# Patient Record
Sex: Female | Born: 1995 | Race: Black or African American | Hispanic: No | Marital: Single | Smoking: Never smoker
Health system: Southern US, Community
[De-identification: ages and names within clinical notes are randomized; demographics above are authoritative.]

---
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
## 2018-09-03 IMAGING — DX DG SHOULDER 2+V*R*
3 series · 3 of 3 positions shown · non-contrast
Comparison: None.

CLINICAL DATA: 20-year-old female with recent trauma and right
anterior shoulder pain.

EXAM:
RIGHT SHOULDER - 2+ VIEW

[shoulder grashey]
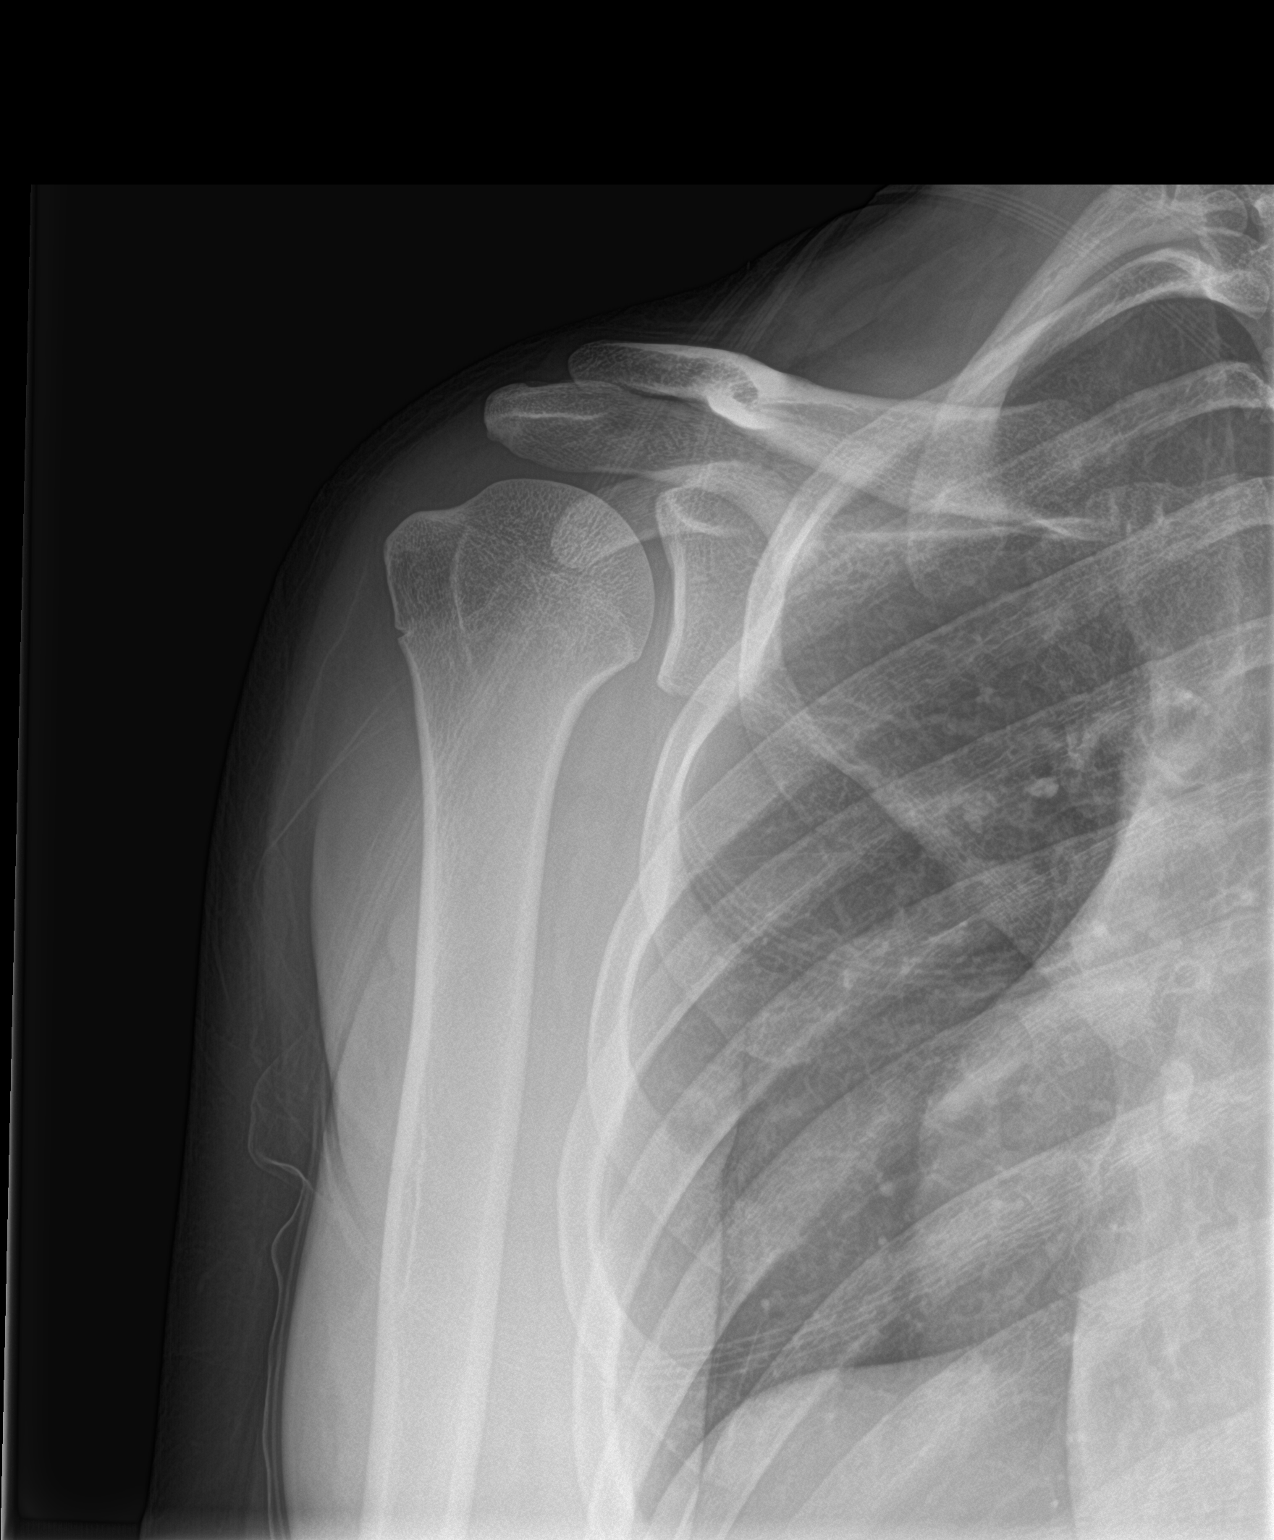

[shoulder y view]
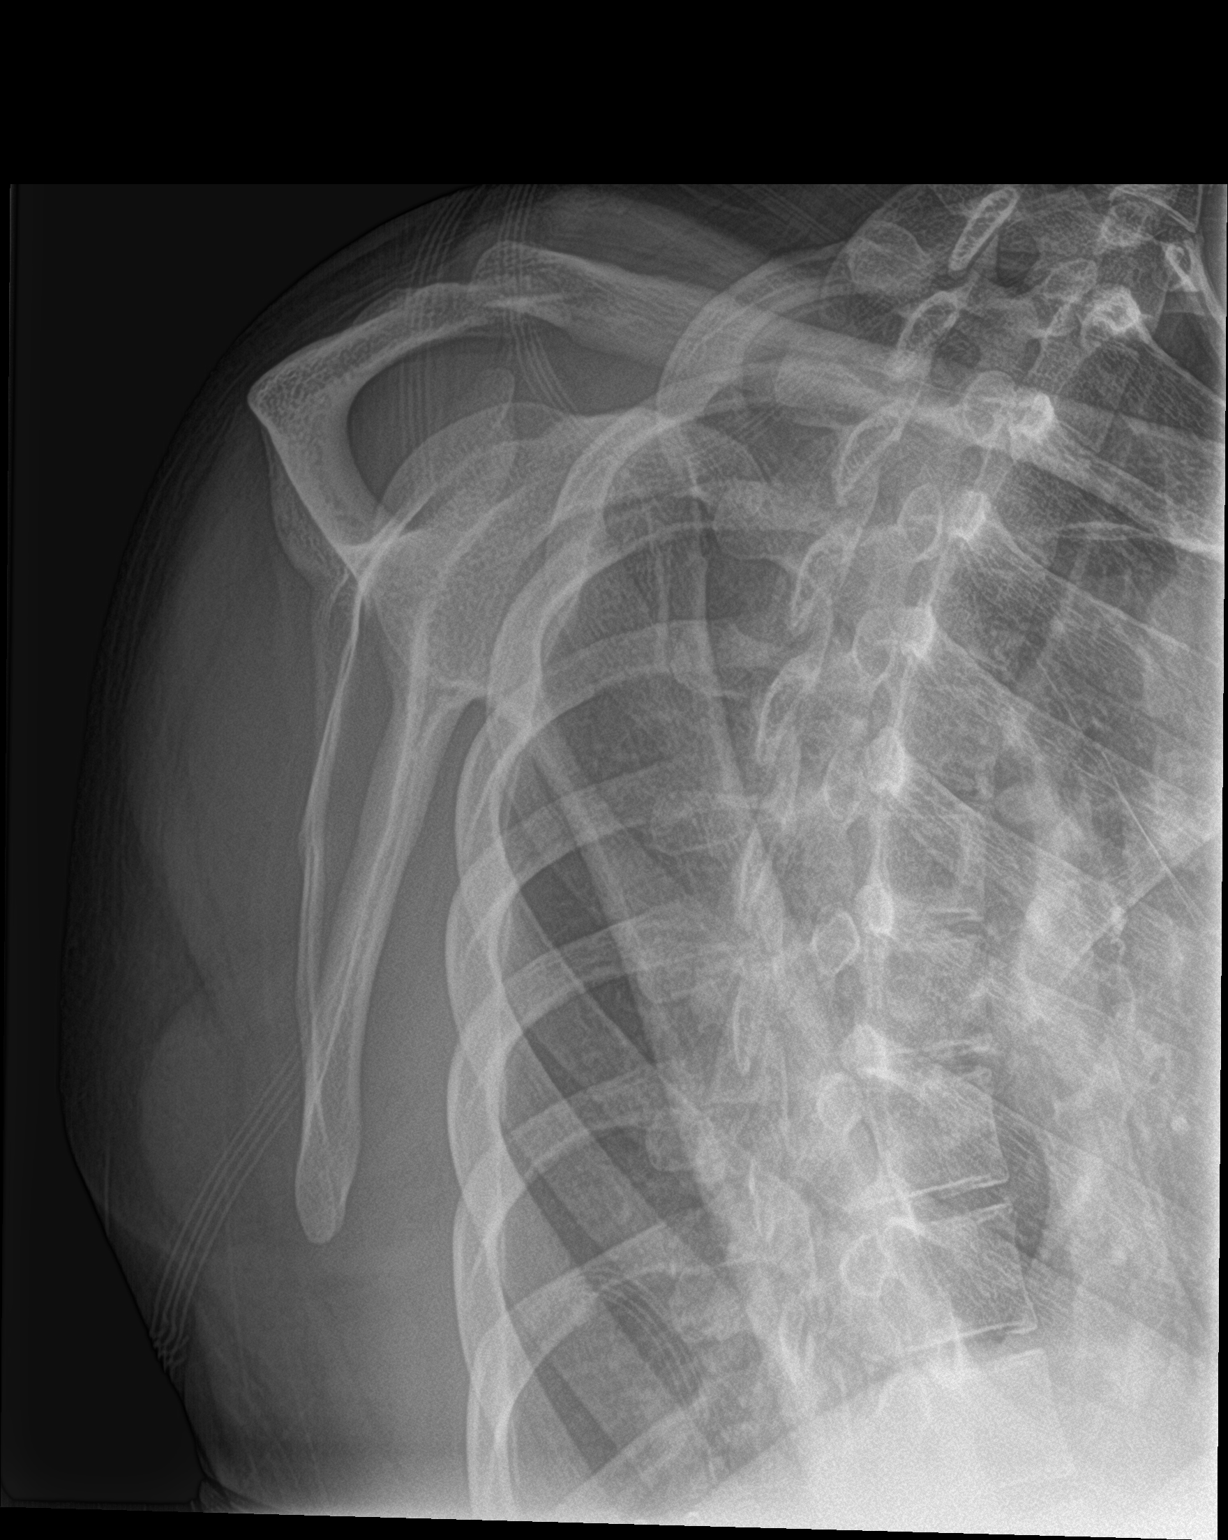

[shoulder axillary]
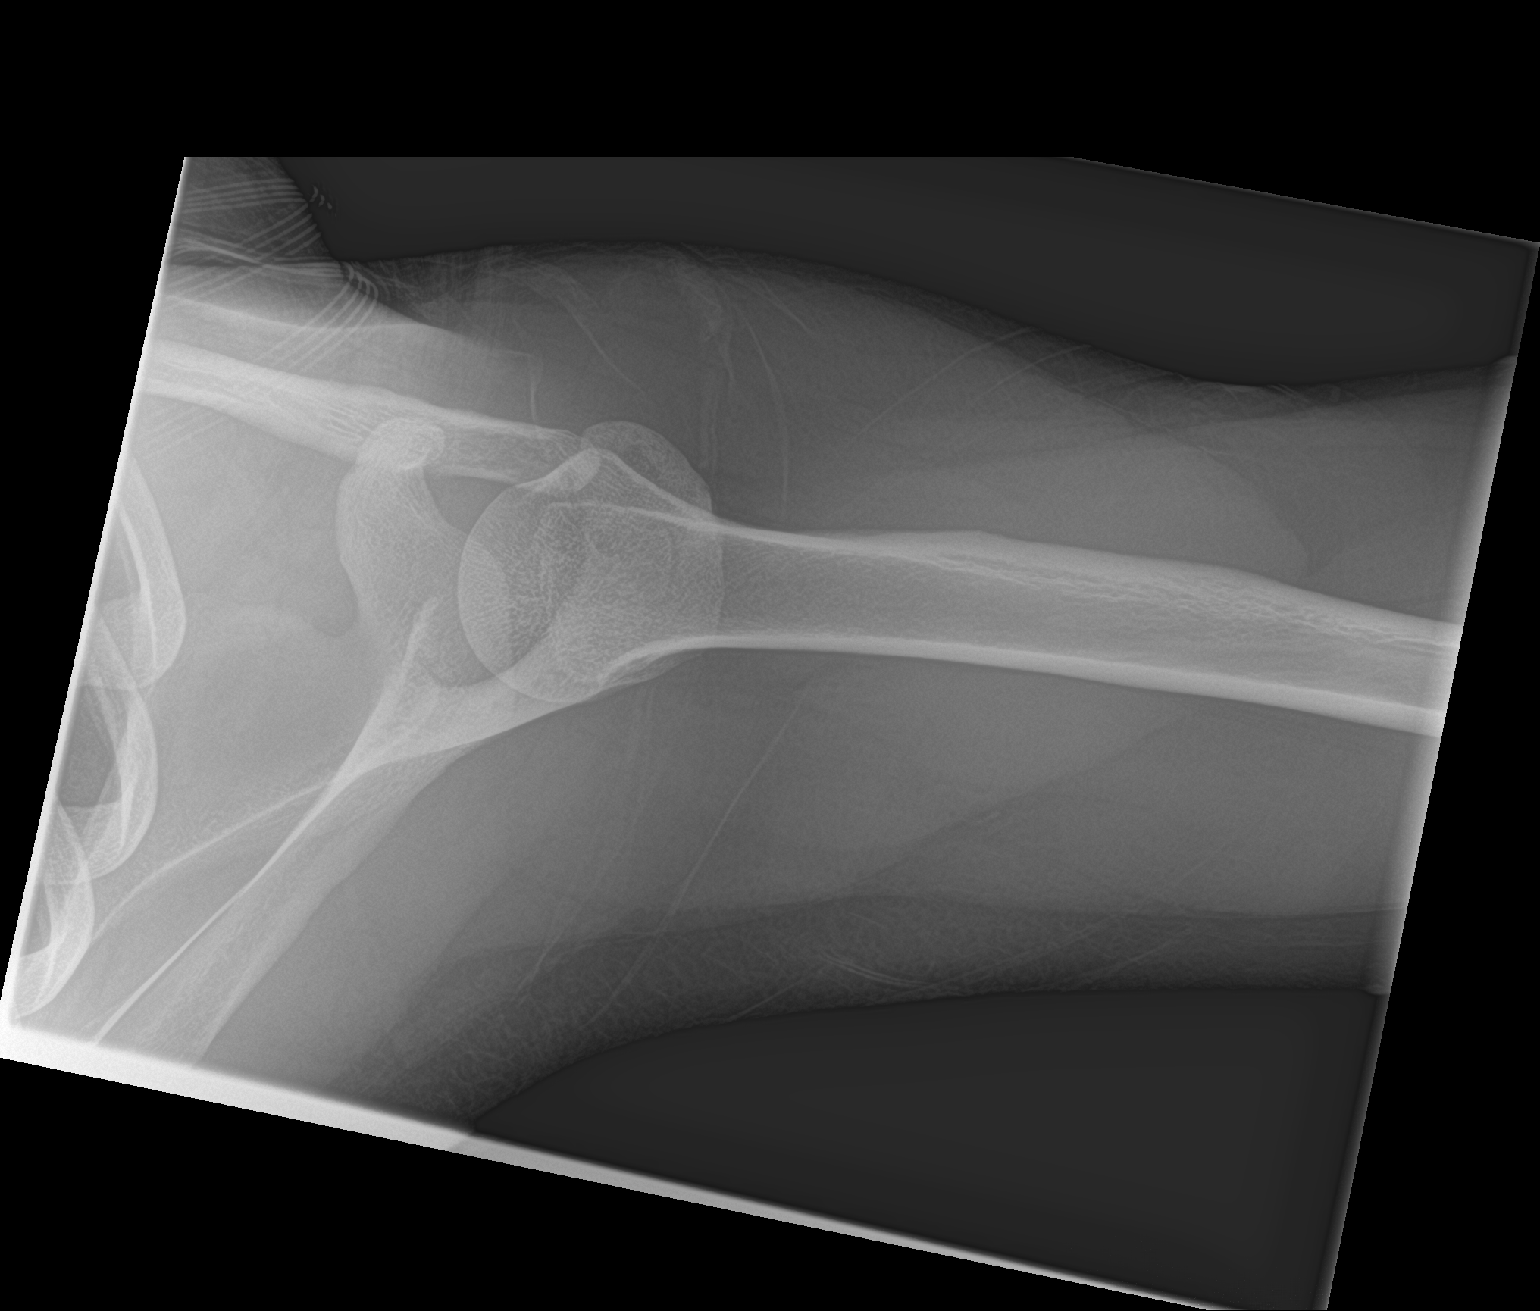

[3 of 3 positions shown; findings below may reference images not displayed]

FINDINGS: There is no evidence of fracture or dislocation. There is no
evidence of arthropathy or other focal bone abnormality. Soft
tissues are unremarkable.
IMPRESSION: Negative.

## 2019-01-04 ENCOUNTER — Emergency Department (HOSPITAL_COMMUNITY)
Admission: EM | Admit: 2019-01-04 | Discharge: 2019-01-04 | Disposition: A | Discharge status: Home / Self Care | Payer: Self-pay | Attending: Emergency Medicine | Admitting: Emergency Medicine

## 2019-01-04 ENCOUNTER — Encounter (HOSPITAL_COMMUNITY): Payer: Self-pay | Admitting: *Deleted

## 2019-01-04 DIAGNOSIS — Z7722 Contact with and (suspected) exposure to environmental tobacco smoke (acute) (chronic): Secondary | ICD-10-CM | POA: Insufficient documentation

## 2019-01-04 DIAGNOSIS — N760 Acute vaginitis: Secondary | ICD-10-CM | POA: Insufficient documentation

## 2019-01-04 DIAGNOSIS — B9689 Other specified bacterial agents as the cause of diseases classified elsewhere: Secondary | ICD-10-CM | POA: Insufficient documentation

## 2019-01-04 LAB — URINALYSIS, ROUTINE W REFLEX MICROSCOPIC
Bilirubin Urine: NEGATIVE
Glucose, UA: NEGATIVE mg/dL
Hgb urine dipstick: NEGATIVE
Ketones, ur: 5 mg/dL — AB
Leukocytes, UA: NEGATIVE
Nitrite: NEGATIVE
Protein, ur: NEGATIVE mg/dL
Specific Gravity, Urine: 1.03 (ref 1.005–1.030)
pH: 5 (ref 5.0–8.0)

## 2019-01-04 LAB — WET PREP, GENITAL
Sperm: NONE SEEN
Trich, Wet Prep: NONE SEEN
Yeast Wet Prep HPF POC: NONE SEEN

## 2019-01-04 LAB — POC URINE PREG, ED: Preg Test, Ur: NEGATIVE

## 2019-01-04 MED ORDER — METRONIDAZOLE 500 MG PO TABS: 500.0000 mg | ORAL_TABLET | Freq: Two times a day (BID) | ORAL | 0 refills | Status: AC

## 2019-01-04 NOTE — ED Provider Notes (Signed)
[name] EMERGENCY DEPARTMENT Provider Note   CSN: [id number] Arrival date & time: 01/04/19  5110   History   Chief Complaint Chief Complaint  Patient presents with  . Abdominal Pain    HPI Renee Vega is a 23 y.o. female.  HPI   23 year old female presents today with complaints of pelvic pain.  Patient notes she had a burning sensation in the right side of her pelvis yesterday that spread to the left.  She denies any vaginal discharge, denies any fever chills nausea or vomiting.  She denies any vaginal pain presently.  She reports she is sexually active with female partner.  She denies any vaginal bleeding.  History reviewed. No pertinent past medical history.  There are no active problems to display for this patient.   History reviewed. No pertinent surgical history.   OB History   No obstetric history on file.      Home Medications    Prior to Admission medications   Medication Sig Start Date End Date Taking? Authorizing Provider  ibuprofen (ADVIL,MOTRIN) 200 MG tablet Take 800 mg by mouth every 6 (six) hours as needed for moderate pain.   Yes [provider]  metroNIDAZOLE (FLAGYL) 500 MG tablet Take 1 tablet (500 mg total) by mouth 2 (two) times daily. 01/04/19   Donnie Gedeon, Tinnie Gens, PA-C  naproxen (NAPROSYN) 500 MG tablet Take 1 tablet (500 mg total) by mouth 2 (two) times daily. Patient not taking: Reported on 01/04/2019 12/30/16   Renne Crigler, PA-C    Family History History reviewed. No pertinent family history.  Social History Social History   Tobacco Use  . Smoking status: Passive Smoke Exposure - Never Smoker  . Smokeless tobacco: Never Used  Substance Use Topics  . Alcohol use: No  . Drug use: No     Allergies   Patient has no known allergies.   Review of Systems Review of Systems  All other systems reviewed and are negative.    Physical Exam Updated Vital Signs BP (!) 139/91 (BP Location: Right Arm)   Pulse  81   Temp 98 F (36.7 C) (Oral)   Resp 20   SpO2 100%   Physical Exam Vitals signs and nursing note reviewed.  Constitutional:      Appearance: She is well-developed.  HENT:     Head: Normocephalic and atraumatic.  Eyes:     General: No scleral icterus.       Right eye: No discharge.        Left eye: No discharge.     Conjunctiva/sclera: Conjunctivae normal.     Pupils: Pupils are equal, round, and reactive to light.  Neck:     Musculoskeletal: Normal range of motion.     Vascular: No JVD.     Trachea: No tracheal deviation.  Pulmonary:     Effort: Pulmonary effort is normal.     Breath sounds: No stridor.  Abdominal:     Comments: Abdomen soft nontender  Genitourinary:    Comments: Sticky white discharge noted in vaginal vault, no cervical motion tenderness Neurological:     Mental Status: She is alert and oriented to person, place, and time.     Coordination: Coordination normal.  Psychiatric:        Behavior: Behavior normal.        Thought Content: Thought content normal.        Judgment: Judgment normal.      ED Treatments / Results  Labs (all labs ordered  are listed, but only abnormal results are displayed) Labs Reviewed  WET PREP, GENITAL - Abnormal; Notable for the following components:      Result Value   Clue Cells Wet Prep HPF POC PRESENT (*)    WBC, Wet Prep HPF POC MANY (*)    All other components within normal limits  URINALYSIS, ROUTINE W REFLEX MICROSCOPIC - Abnormal; Notable for the following components:   Ketones, ur 5 (*)    All other components within normal limits  POC URINE PREG, ED  GC/CHLAMYDIA PROBE AMP ([place]) NOT AT [facility]    EKG None  Radiology No results found.  Procedures Procedures (including critical care time)  Medications Ordered in ED Medications - No data to display   Initial Impression / Assessment and Plan / ED Course  I have reviewed the triage vital signs and the nursing notes.  Pertinent labs &  imaging results that were available during my care of the patient were reviewed by me and considered in my medical decision making (see chart for details).     Patient's presentation was consistent with bacterial vaginosis.  I discussed STD testing and treatment, patient would not like treatment at this time.  She has no signs of PID or any other intrapelvic pathology.  Discharged with symptomatic care instructions, metronidazole and strict return precautions.  She verbalized understanding and agreement to today's plan.  Final Clinical Impressions(s) / ED Diagnoses   Final diagnoses:  Bacterial vaginosis    ED Discharge Orders         Ordered    metroNIDAZOLE (FLAGYL) 500 MG tablet  2 times daily     01/04/19 1154           Rosalio Loud 01/04/19 1157    Tilden Fossa, MD 01/04/19 Windell Moment

## 2019-01-04 NOTE — ED Triage Notes (Signed)
Pt in c/o alternating right and left suprapubic pain, describes as a burning, currently pain to the left side, pain is intermittent, denies n/v/d, denies vaginal discharge or pain with urination

## 2019-01-04 NOTE — Discharge Instructions (Signed)
Please read attached information. If you experience any new or worsening signs or symptoms please return to the emergency room for evaluation. Please follow-up with your primary care provider or specialist as discussed. Please use medication prescribed only as directed and discontinue taking if you have any concerning signs or symptoms.   °

## 2019-01-04 NOTE — ED Notes (Signed)
PA at bedside.

## 2019-01-05 LAB — GC/CHLAMYDIA PROBE AMP (~~LOC~~) NOT AT ARMC
Chlamydia: NEGATIVE
Neisseria Gonorrhea: NEGATIVE

## 2019-02-25 ENCOUNTER — Other Ambulatory Visit: Payer: Self-pay

## 2019-02-25 ENCOUNTER — Emergency Department (HOSPITAL_COMMUNITY)
Admission: EM | Admit: 2019-02-25 | Discharge: 2019-02-26 | Disposition: A | Discharge status: Home / Self Care | Payer: Self-pay | Attending: Emergency Medicine | Admitting: Emergency Medicine

## 2019-02-25 ENCOUNTER — Encounter (HOSPITAL_COMMUNITY): Payer: Self-pay

## 2019-02-25 DIAGNOSIS — Z7722 Contact with and (suspected) exposure to environmental tobacco smoke (acute) (chronic): Secondary | ICD-10-CM | POA: Insufficient documentation

## 2019-02-25 DIAGNOSIS — N760 Acute vaginitis: Secondary | ICD-10-CM | POA: Insufficient documentation

## 2019-02-25 DIAGNOSIS — B9689 Other specified bacterial agents as the cause of diseases classified elsewhere: Secondary | ICD-10-CM

## 2019-02-25 NOTE — ED Triage Notes (Signed)
Pt having vaginal discharge and irritation for 3 days. Discharge is yellow and has slight smell.

## 2019-02-26 LAB — WET PREP, GENITAL
Sperm: NONE SEEN
Trich, Wet Prep: NONE SEEN
Yeast Wet Prep HPF POC: NONE SEEN

## 2019-02-26 MED ORDER — METRONIDAZOLE 500 MG PO TABS
500.0000 mg | ORAL_TABLET | Freq: Once | ORAL | Status: AC
  Administered 2019-02-26: 500 mg via ORAL
  Filled 2019-02-26: qty 1

## 2019-02-26 NOTE — ED Provider Notes (Signed)
[name] EMERGENCY DEPARTMENT Provider Note   CSN: [id number] Arrival date & time: 02/25/19  2241    History   Chief Complaint Chief Complaint  Patient presents with  . Vaginal Discharge    HPI Renee Vega is a 23 y.o. female.     HPI 23 year old female comes in a chief complaint of vaginal discharge. Patient states that she has been having 3 to 4 days of vaginal discharge.  She has had history of bacterial vaginosis and her symptoms are similar.  She is having copious, foul-smelling yellow discharge.  She states that she is having unprotected intercourse with 1 partner and has no abdominal pain or vaginal bleeding.  Patient does indicate that she has had STDs about 3 years ago.   History reviewed. No pertinent past medical history.  There are no active problems to display for this patient.   History reviewed. No pertinent surgical history.   OB History   No obstetric history on file.      Home Medications    Prior to Admission medications   Medication Sig Start Date End Date Taking? Authorizing Provider  ibuprofen (ADVIL,MOTRIN) 200 MG tablet Take 800 mg by mouth every 6 (six) hours as needed for moderate pain.    [provider]  metroNIDAZOLE (FLAGYL) 500 MG tablet Take 1 tablet (500 mg total) by mouth 2 (two) times daily. 01/04/19   Hedges, Tinnie Gens, PA-C  naproxen (NAPROSYN) 500 MG tablet Take 1 tablet (500 mg total) by mouth 2 (two) times daily. Patient not taking: Reported on 01/04/2019 12/30/16   Renne Crigler, PA-C    Family History History reviewed. No pertinent family history.  Social History Social History   Tobacco Use  . Smoking status: Passive Smoke Exposure - Never Smoker  . Smokeless tobacco: Never Used  Substance Use Topics  . Alcohol use: No  . Drug use: No     Allergies   Patient has no known allergies.   Review of Systems Review of Systems  Gastrointestinal: Negative for abdominal pain.   Genitourinary: Positive for vaginal discharge.     Physical Exam Updated Vital Signs BP 119/72   Pulse (!) 54   Temp 97.8 F (36.6 C) (Oral)   Ht 5\' 6"  (1.676 m)   Wt 73.5 kg   LMP 02/24/2019 (Exact Date)   SpO2 99%   BMI 26.15 kg/m   Physical Exam Constitutional:      Appearance: She is well-developed.  HENT:     Head: Normocephalic and atraumatic.  Eyes:     Conjunctiva/sclera: Conjunctivae normal.     Pupils: Pupils are equal, round, and reactive to light.  Neck:     Musculoskeletal: Normal range of motion and neck supple.  Cardiovascular:     Rate and Rhythm: Normal rate and regular rhythm.     Heart sounds: Normal heart sounds. No murmur.  Pulmonary:     Effort: Pulmonary effort is normal. No respiratory distress.     Breath sounds: No wheezing.  Abdominal:     General: Bowel sounds are normal. There is no distension.     Palpations: Abdomen is soft.     Tenderness: There is no abdominal tenderness. There is no guarding or rebound.  Genitourinary:    Vagina: Normal.     Comments: External exam - normal, no lesions Speculum exam: Pt has some white discharge, no blood Bimanual exam: Patient has no CMT, no adnexal tenderness or fullness and cervical os is closed Skin:  General: Skin is warm and dry.  Neurological:     Mental Status: She is alert and oriented to person, place, and time.      ED Treatments / Results  Labs (all labs ordered are listed, but only abnormal results are displayed) Labs Reviewed  WET PREP, GENITAL - Abnormal; Notable for the following components:      Result Value   Clue Cells Wet Prep HPF POC PRESENT (*)    WBC, Wet Prep HPF POC MANY (*)    All other components within normal limits  POC URINE PREG, ED  GC/CHLAMYDIA PROBE AMP ([place]) NOT AT [facility]    EKG None  Radiology No results found.  Procedures Procedures (including critical care time)  Medications Ordered in ED Medications  metroNIDAZOLE (FLAGYL) tablet  500 mg (has no administration in time range)     Initial Impression / Assessment and Plan / ED Course  I have reviewed the triage vital signs and the nursing notes.  Pertinent labs & imaging results that were available during my care of the patient were reviewed by me and considered in my medical decision making (see chart for details).        23 year old comes in a chief complaint of vaginal discharge.  She has history of STIs and also bacterial vaginosis.  She currently does not have high risk sexual behavior besides having unprotected intercourse with her partner with whom she is in monogamous relationship she reports that she has had similar symptoms in the past with BV.  Pelvic exam is reassuring.  Wet prep is positive for clue cells and inflammatory cells.  At this time patient has been advised to have protected intercourse only.  She will need to be treated with STI if her swabs are positive.  Final Clinical Impressions(s) / ED Diagnoses   Final diagnoses:  BV (bacterial vaginosis)    ED Discharge Orders    None       Derwood Kaplan, MD 02/26/19 0145

## 2019-02-26 NOTE — Discharge Instructions (Addendum)
Take the medicines prescribed for bacterial vaginosis.  If you have any STIs you will receive a call from the hospital.

## 2019-02-27 LAB — POC URINE PREG, ED: Preg Test, Ur: NEGATIVE

## 2019-02-27 LAB — GC/CHLAMYDIA PROBE AMP (~~LOC~~) NOT AT ARMC
Chlamydia: NEGATIVE
NEISSERIA GONORRHEA: NEGATIVE

## 2022-07-30 ENCOUNTER — Other Ambulatory Visit: Payer: Self-pay

## 2022-07-30 ENCOUNTER — Emergency Department (HOSPITAL_COMMUNITY)
Admission: EM | Admit: 2022-07-30 | Discharge: 2022-07-30 | Disposition: A | Discharge status: Home / Self Care | Payer: Self-pay | Attending: Emergency Medicine | Admitting: Emergency Medicine

## 2022-07-30 DIAGNOSIS — Z711 Person with feared health complaint in whom no diagnosis is made: Secondary | ICD-10-CM | POA: Insufficient documentation

## 2022-07-30 NOTE — ED Provider Notes (Signed)
[name] EMERGENCY DEPARTMENT Provider Note   CSN: [id number] Arrival date & time: 07/30/22  1728     History  Chief Complaint  Patient presents with   Fibroids    Renee Vega is a 26 y.o. female.  The history is provided by the patient. No language interpreter was used.     26 year old female presents with concerns of potential uterine fibroid.  Patient states last month she went to the health clinic for yearly checkup.  States that the provider was pushing on her abdomen and states that she may have a uterine fibroid.  Is without any complaint except occasionally felt a little twinge sensation in her lower abdomen.  She denies any fever nausea vomiting diarrhea dysuria hematuria excessive vaginal bleeding or discharge.  She does not have any other medical problems.  She is here requesting for an ultrasound to confirm her uterine fibroid.  No treatment tried at home.  Home Medications Prior to Admission medications   Medication Sig Start Date End Date Taking? Authorizing Provider  ibuprofen (ADVIL,MOTRIN) 200 MG tablet Take 800 mg by mouth every 6 (six) hours as needed for moderate pain.    [provider]  metroNIDAZOLE (FLAGYL) 500 MG tablet Take 1 tablet (500 mg total) by mouth 2 (two) times daily. 01/04/19   Hedges, Tinnie Gens, PA-C  naproxen (NAPROSYN) 500 MG tablet Take 1 tablet (500 mg total) by mouth 2 (two) times daily. Patient not taking: Reported on 01/04/2019 12/30/16   Renne Crigler, PA-C      Allergies    Patient has no known allergies.    Review of Systems   Review of Systems  All other systems reviewed and are negative.   Physical Exam Updated Vital Signs BP (!) 142/89 (BP Location: Right Arm)   Pulse 99   Temp 99.7 F (37.6 C) (Oral)   Resp 16   SpO2 97%  Physical Exam Vitals and nursing note reviewed.  Constitutional:      General: She is not in acute distress.    Appearance: She is well-developed.  HENT:     Head:  Atraumatic.  Eyes:     Conjunctiva/sclera: Conjunctivae normal.  Pulmonary:     Effort: Pulmonary effort is normal.  Abdominal:     Palpations: Abdomen is soft.     Tenderness: There is no abdominal tenderness.  Musculoskeletal:     Cervical back: Neck supple.  Skin:    Findings: No rash.  Neurological:     Mental Status: She is alert.  Psychiatric:        Mood and Affect: Mood normal.     ED Results / Procedures / Treatments   Labs (all labs ordered are listed, but only abnormal results are displayed) Labs Reviewed - No data to display  EKG None  Radiology No results found.  Procedures Procedures    Medications Ordered in ED Medications - No data to display  ED Course/ Medical Decision Making/ A&P                           Medical Decision Making  BP (!) 142/89 (BP Location: Right Arm)   Pulse 99   Temp 99.7 F (37.6 C) (Oral)   Resp 16   SpO2 97%   5:53 PM This is a generally healthy 26 year old female presenting requesting to be evaluated for potential uterine fibroid.  She reports she was seen at the health clinic a month ago  for yearly checkup and states the provider was palpating her abdomen and told her that she may have a uterine fibroid.  Patient without any specific complaint except occasional "twinge" sensation in her abdomen without any significant pain.  She denies any excessive vaginal bleeding, having nausea vomiting diarrhea dysuria or any other concerning symptom.  No active pain.  No history of fibroid.  On exam this is a well-appearing female appears to be in no acute discomfort.  No significant reproducible abdominal tenderness.  I encourage patient to follow-up with her primary care provider for outpatient ultrasound for further assessment however no immediate urgent need for ER intervention at this time.  Patient voiced understanding and agrees with plan.  Return precaution given.        Final Clinical Impression(s) / ED  Diagnoses Final diagnoses:  Worried well    Rx / DC Orders ED Discharge Orders     None         Fayrene Helper, PA-C 07/30/22 1756    Loetta Rough, MD 07/31/22 [phone]

## 2022-07-30 NOTE — Discharge Instructions (Signed)
Please consider follow-up with your primary care provider and request for pelvic ultrasound if you have concerns about uterine fibroid.  If you develop severe vaginal bleeding with severe abdominal pain please return to the ER for further assessment.

## 2022-07-30 NOTE — ED Triage Notes (Signed)
Pt presents stating she has had diagnosis of fibroid on the right. States she has followed the physician's recommendations but still feels what she describes as a "twinge" at night when she lies down. No complaints today however. No fever, chills, n/v/d or urinary symptoms. No vaginal disharge or bleeding.

## 2022-12-24 ENCOUNTER — Other Ambulatory Visit: Payer: Self-pay

## 2022-12-24 ENCOUNTER — Encounter: Payer: Self-pay | Admitting: Obstetrics and Gynecology

## 2022-12-24 ENCOUNTER — Ambulatory Visit (INDEPENDENT_AMBULATORY_CARE_PROVIDER_SITE_OTHER): Payer: Self-pay | Admitting: Obstetrics and Gynecology

## 2022-12-24 VITALS — BP 129/84 | HR 60 | Ht 66.0 in | Wt 136.0 lb

## 2022-12-24 DIAGNOSIS — R1900 Intra-abdominal and pelvic swelling, mass and lump, unspecified site: Secondary | ICD-10-CM

## 2022-12-24 NOTE — Patient Instructions (Addendum)
Getting a pelvic ultrasound to look for fibroids and get a better look at your ovaries.

## 2022-12-24 NOTE — Progress Notes (Signed)
    GYNECOLOGY VISIT  Patient name: Renee Vega MRN [id number]  Date of birth: 28-Sep-1996 Chief Complaint:   Gynecologic Exam  History:  Tyniya Kuyper is a 27 y.o. being seen today for concern for fibroid. Tingling feeling in her abdomen and feels she can't hold her urine. Menses are regular, not heavy or painful.     Last menses was totally fine - no pain. Lump on right side. Having some discomfort while sleeping due to burning sensation in lower abdomen. Burning sensatino was only at night, last felt in October and none since then. Not currently sexually active; no pain with penetrative intercours previously. Not using nuvaring as she is not sexually active. Lump that was felt previously feels like it's in the middle. Mom, has hx of fibroids and states it feels the same as when she was diagnosed.   No past medical history on file.  No past surgical history on file.  The following portions of the patient's history were reviewed and updated as appropriate: allergies, current medications, past family history, past medical history, past social history, past surgical history and problem list.   Health Maintenance:   Last pap reported 2023 but results not able to be located Last mammogram: n/a   Review of Systems:  Pertinent items are noted in HPI. Comprehensive review of systems was otherwise negative.   Objective:  Physical Exam BP 129/84   Pulse 60   Ht 5\' 6"  (1.676 m)   Wt 136 lb (61.7 kg)   LMP 12/18/2022 (Exact Date)   BMI 21.95 kg/m    Physical Exam Vitals and nursing note reviewed.  Constitutional:      Appearance: Normal appearance.  HENT:     Head: Normocephalic and atraumatic.  Pulmonary:     Effort: Pulmonary effort is normal.     Breath sounds: Normal breath sounds.  Abdominal:     Comments: 16 week size presumed uterus, mild tenderness to palpation, mobile   Genitourinary:    Vagina: Normal.     Cervix: Normal.  Skin:    General: Skin is warm and dry.   Neurological:     General: No focal deficit present.     Mental Status: She is alert.  Psychiatric:        Mood and Affect: Mood normal.        Behavior: Behavior normal.        Thought Content: Thought content normal.        Judgment: Judgment normal.    Labs and Imaging No results found.     Assessment & Plan:   1. Pelvic mass Suspect likely fibroid on examination, [place] ordered. Not having heavy bleeding or pain and not currently trying to get pregnant. Will discuss management pending [place].  - US PELVIC COMPLETE WITH TRANSVAGINAL; Future  Routine preventative health maintenance measures emphasized.  Darliss Cheney, MD [facility] [place]

## 2023-01-04 LAB — POCT URINALYSIS DIP (DEVICE)
Bilirubin Urine: NEGATIVE
Glucose, UA: NEGATIVE mg/dL
Hgb urine dipstick: NEGATIVE
Ketones, ur: NEGATIVE mg/dL
Leukocytes,Ua: NEGATIVE
Nitrite: NEGATIVE
Protein, ur: NEGATIVE mg/dL
Specific Gravity, Urine: 1.025 (ref 1.005–1.030)
Urobilinogen, UA: 0.2 mg/dL (ref 0.0–1.0)
pH: 6 (ref 5.0–8.0)

## 2023-01-13 ENCOUNTER — Ambulatory Visit
Admission: RE | Admit: 2023-01-13 | Discharge: 2023-01-13 | Disposition: A | Discharge status: Home / Self Care | Payer: Self-pay | Source: Ambulatory Visit | Attending: Obstetrics and Gynecology | Admitting: Obstetrics and Gynecology

## 2023-01-13 DIAGNOSIS — R1900 Intra-abdominal and pelvic swelling, mass and lump, unspecified site: Secondary | ICD-10-CM | POA: Insufficient documentation

## 2023-02-15 ENCOUNTER — Encounter: Payer: Self-pay | Admitting: Obstetrics and Gynecology

## 2023-02-15 ENCOUNTER — Other Ambulatory Visit: Payer: Self-pay

## 2023-02-15 ENCOUNTER — Ambulatory Visit (INDEPENDENT_AMBULATORY_CARE_PROVIDER_SITE_OTHER): Payer: Self-pay | Admitting: Obstetrics and Gynecology

## 2023-02-15 VITALS — BP 132/81 | HR 71 | Wt 133.6 lb

## 2023-02-15 DIAGNOSIS — D219 Benign neoplasm of connective and other soft tissue, unspecified: Secondary | ICD-10-CM

## 2023-02-15 NOTE — Progress Notes (Unsigned)
GYNECOLOGY VISIT  Patient name: Renee Vega MRN [id number]  Date of birth: July 23, 1996 Chief Complaint:   Ultrasound Follow Up  History:  Renee Vega is a 27 y.o. being seen today for uterine fibroids.  Constipation improved with dietary changes. Overall has been doing well. No issues with menses at this time and not intending to get pregnant in the immediate future.   No past medical history on file.  No past surgical history on file.  The following portions of the patient's history were reviewed and updated as appropriate: allergies, current medications, past family history, past medical history, past social history, past surgical history and problem list.    Review of Systems:  Pertinent items are noted in HPI. Comprehensive review of systems was otherwise negative.   Objective:  Physical Exam BP 132/81   Pulse 71   Wt 133 lb 9.6 oz (60.6 kg)   LMP 02/07/2023   BMI 21.56 kg/m    Physical Exam Vitals and nursing note reviewed.  Constitutional:      Appearance: Normal appearance.  HENT:     Head: Normocephalic and atraumatic.  Pulmonary:     Effort: Pulmonary effort is normal.  Skin:    General: Skin is warm and dry.  Neurological:     General: No focal deficit present.     Mental Status: She is alert.  Psychiatric:        Mood and Affect: Mood normal.        Behavior: Behavior normal.        Thought Content: Thought content normal.        Judgment: Judgment normal.      Labs and Imaging US PELVIC COMPLETE WITH TRANSVAGINAL CLINICAL DATA:  Uterine mass and pain on the right side. The patient's last menstrual period was 12/18/2022.  EXAM: TRANSABDOMINAL AND TRANSVAGINAL ULTRASOUND OF PELVIS  TECHNIQUE: Both transabdominal and transvaginal ultrasound examinations of the pelvis were performed. Transabdominal technique was performed for global imaging of the pelvis including uterus, ovaries, adnexal regions, and pelvic cul-de-sac. It was necessary to  proceed with endovaginal exam following the transabdominal exam to visualize the adnexa.  COMPARISON:  Abdominal radiograph dated 08/08/2014.  FINDINGS: Uterus  Measurements: 14.2 x 8.4 x 9.2 cm = volume: 569 mL. An intramural mass in the uterine fundus measures 7.7 x 7.5 x 9.5 cm. This likely represents a uterine fibroid.  Endometrium  Thickness: 15 mm.  No focal abnormality visualized.  Right ovary  Not definitely visualized. A cystic mass with multiple thin septations versus a tubular cystic structure in the left adnexa measures approximately 8.6 x 6.2 cm.  Left ovary  Measurements: 5.4 x 3.8 x 4.5 cm = volume: 49 mL. A cystic mass with multiple thin septations versus a tubular cystic structure in the left adnexa measures approximately 4.7 x 3.2 cm. A left adnexal mass with low-level internal echoes and no internal blood flow measures 3.6 x 2.9 x 3.6 cm.  Other findings  No abnormal free fluid.  IMPRESSION: 1. A left adnexal mass with low-level internal echoes and no internal blood flow may represent an endometrioma. Recommend follow-up pelvic ultrasound in 6-12 weeks, then if not surgically removed, follow-up yearly. 2. Cystic masses in the bilateral adnexa are indeterminate. Differential considerations include hydrosalpinx and ovarian neoplasm. Recommend attention to these areas on follow-up ultrasound in 6-12 weeks. 3. Large intramural mass in the uterine fundus is most consistent with a uterine fibroid.  Electronically Signed   By: Zerita Boers  M.D.   On: 01/13/2023 20:53       Assessment & Plan:   1. Fibroid Reviewed US imaging including fibroid and suspected hydrosalpinx/ovarian cyst. Patient with palpable fibroid on exam without changes in menses and slight bulk symptoms.  Not currently interested in surgical intervention given that it is not bothering her too much and no intention of pregnancy in the near future Plan for surveillance - 1 year  repeat ultrasound unless symptoms change or new symptoms arise   Routine preventative health maintenance measures emphasized.  Darliss Cheney, MD [facility] [place]

## 2024-10-23 ENCOUNTER — Encounter: Payer: Self-pay | Admitting: Obstetrics & Gynecology

## 2024-10-24 ENCOUNTER — Telehealth: Payer: Self-pay

## 2024-10-24 NOTE — Telephone Encounter (Signed)
 Telephoned patient at mobile number. Patient completed BCCCP screening process and exceeds federal poverty guidelines. Patient would like to keep colpo appointment. I gave the patient scheduling telephone number for [facility] for [facility]. Patient voiced understanding that the appointment will not be funded by [employer].

## 2024-12-13 NOTE — Progress Notes (Unsigned)
" ° ° °  GYNECOLOGY OFFICE COLPOSCOPY PROCEDURE NOTE  29 y.o. G0P0000 here for colposcopy for low-grade squamous intraepithelial neoplasia (LGSIL - encompassing HPV,mild dysplasia,CIN I) pap smear on 10/09/24. Discussed role for HPV in cervical dysplasia, need for surveillance.  Patient gave informed written consent, time out was performed.  Placed in lithotomy position. Cervix viewed with speculum and colposcope after application of acetic acid.   Colposcopy adequate? {yes/no:20286}  {Findings; colposcopy:728}; corresponding biopsies obtained.  ECC specimen obtained. All specimens were labeled and sent to pathology.  Chaperone was present during entire procedure.  Patient was given post procedure instructions.  Will follow up pathology and manage accordingly; patient will be contacted with results and recommendations.  Routine preventative health maintenance measures emphasized.   Carter Quarry, MD, FACOG Minimally Invasive Gynecologic Surgery  Obstetrics and Gynecology, [facility] for [facility], [facility] 12/13/2024   "

## 2024-12-21 ENCOUNTER — Telehealth: Payer: Self-pay | Admitting: Family Medicine

## 2024-12-21 ENCOUNTER — Ambulatory Visit: Payer: Self-pay | Admitting: Obstetrics and Gynecology

## 2024-12-21 ENCOUNTER — Other Ambulatory Visit: Payer: Self-pay

## 2024-12-21 NOTE — Telephone Encounter (Signed)
 Attempted to call patient to schedule Colpo appt. Patient was initially referred to [facility] because she has no insurance and needs a Colpo. Unfortunately she did not qualify for the [facility] program so she will have to be a self pay pt. I called pt to explain this to her and also let her know that she will have to pay an estimate of $224.40 the day of the appointment in order to have this procedure done in our office. Left VM and call back number for pt to call and reschedule.

## 2024-12-21 NOTE — Telephone Encounter (Signed)
 Good Morning! This patient is needing a colpo and pap and is uninsured. We let her know that someone from your office will be contacting her.

## 2024-12-25 NOTE — Progress Notes (Unsigned)
" ° ° °  GYNECOLOGY OFFICE COLPOSCOPY PROCEDURE NOTE  28 y.o. G0P0000 here for colposcopy for LSIL, HPV not done pap smear on 10/2024.   Pap History: 10/2024: LSIL, no HPV done.   Pregnancy test:  {Blank single:19197::positive,negative,not indicated} Gardasil:  {Blank single:19197::completed,not yet had, declines,not yet had, accepts}. Discussed role for HPV in cervical dysplasia, need for surveillance.  Informed consent and review of risks, benefit and alternatives performed. Written consent given.   Speculum inserted into patient's vagina assuring full view of cervix and vaginal walls. 3 swabs of vinegar solution applied to the cervix and vaginal walls and colposcope was used to observe both the cervix and vaginal walls.   Colposcopy adequate? {yes/no:20286} {pad colposcopy findings:34045}.  Corresponding biopsies obtained? {yes/no:20286} ECC collected? {yes/no:20286}  Specimen(s) labeled and sent to pathology.  {colpo hemostasis:34046} Speculum removed.  Pt tolerated well with minimal pain and bleeding.   Patient was given post procedure instructions.  Will follow up pathology and manage accordingly; patient will be contacted with results and recommendations.  Routine preventative health maintenance measures emphasized.    Vina Solian, MD, FACOG Obstetrician & Gynecologist, [facility], [facility]       "

## 2024-12-26 ENCOUNTER — Other Ambulatory Visit: Payer: Self-pay

## 2024-12-26 ENCOUNTER — Ambulatory Visit: Payer: Self-pay | Admitting: Obstetrics and Gynecology

## 2024-12-26 ENCOUNTER — Other Ambulatory Visit (HOSPITAL_COMMUNITY)
Admission: RE | Admit: 2024-12-26 | Discharge: 2024-12-26 | Disposition: A | Discharge status: Home / Self Care | Payer: Self-pay | Source: Ambulatory Visit | Attending: Obstetrics and Gynecology | Admitting: Obstetrics and Gynecology

## 2024-12-26 VITALS — BP 123/87 | HR 68 | Wt 145.3 lb

## 2024-12-26 DIAGNOSIS — R87619 Unspecified abnormal cytological findings in specimens from cervix uteri: Secondary | ICD-10-CM | POA: Insufficient documentation

## 2024-12-26 LAB — POCT PREGNANCY, URINE: Preg Test, Ur: NEGATIVE

## 2024-12-26 NOTE — Addendum Note (Signed)
 Addended byBETHA BARTHOLOMEW BARMAN on: 12/26/2024 09:38 AM   Modules accepted: Orders

## 2024-12-27 ENCOUNTER — Ambulatory Visit: Payer: Self-pay | Admitting: Obstetrics and Gynecology

## 2024-12-27 LAB — SURGICAL PATHOLOGY
